# Patient Record
Sex: Female | Born: 1990 | Race: Black or African American | Hispanic: No | Marital: Single | Smoking: Never smoker
Health system: Southern US, Community
[De-identification: ages and names within clinical notes are randomized; demographics above are authoritative.]

## PROBLEM LIST (undated history)

## (undated) HISTORY — PX: KNEE ARTHROSCOPY: SUR90

---
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
## 2010-01-19 ENCOUNTER — Ambulatory Visit: Payer: Self-pay | Admitting: Physician Assistant

## 2010-01-20 ENCOUNTER — Encounter: Admission: RE | Admit: 2010-01-20 | Discharge: 2010-01-20 | Payer: Self-pay | Admitting: Family Medicine

## 2010-02-09 ENCOUNTER — Ambulatory Visit: Payer: Self-pay | Admitting: Family Medicine

## 2010-04-01 ENCOUNTER — Encounter (INDEPENDENT_AMBULATORY_CARE_PROVIDER_SITE_OTHER): Payer: Self-pay | Admitting: *Deleted

## 2010-04-09 ENCOUNTER — Emergency Department (HOSPITAL_COMMUNITY)
Admission: EM | Admit: 2010-04-09 | Discharge: 2010-04-09 | Payer: Self-pay | Source: Home / Self Care | Admitting: Emergency Medicine

## 2010-04-23 ENCOUNTER — Encounter: Admission: RE | Admit: 2010-04-23 | Discharge: 2010-04-23 | Payer: Self-pay | Admitting: Gastroenterology

## 2010-04-29 ENCOUNTER — Emergency Department (HOSPITAL_COMMUNITY): Admission: EM | Admit: 2010-04-29 | Discharge: 2010-03-18 | Payer: Self-pay | Admitting: Emergency Medicine

## 2010-05-03 ENCOUNTER — Encounter (INDEPENDENT_AMBULATORY_CARE_PROVIDER_SITE_OTHER): Payer: Self-pay | Admitting: *Deleted

## 2010-05-07 ENCOUNTER — Ambulatory Visit: Payer: Self-pay | Admitting: Gastroenterology

## 2010-06-22 NOTE — Letter (Signed)
Summary: New Patient letter  [facility]  [address] [place], [place] [address]   Phone: [phone]  Fax: [phone]       04/01/2010 MRN: [id number]    Gloria Edwards [address] [place], [place]  [address]  Dear Ms. Kellie Shropshire,  Welcome to the Gastroenterology Division at [facility].    You are scheduled to see DR. PATTERSON on May 07, 2010 at 9:00 A.M. on the 3rd floor at [facility], [address]  We ask that you try to arrive at our office 15 minutes prior to your appointment time to allow for check-in.  We would like you to complete the enclosed self-administered evaluation form prior to your visit and bring it with you on the day of your appointment.  We will review it with you.  Also, please bring a complete list of all your medications or, if you prefer, bring the medication bottles and we will list them.  Please bring your insurance card so that we may make a copy of it.  If your insurance requires a referral to see a specialist, please bring your referral form from your primary care physician.  Co-payments are due at the time of your visit and may be paid by cash, check or credit card.     Your office visit will consist of a consult with your physician (includes a physical exam), any laboratory testing he/she may order, scheduling of any necessary diagnostic testing (e.g. x-ray, ultrasound, CT-scan), and scheduling of a procedure (e.g. Endoscopy, Colonoscopy) if required.  Please allow enough time on your schedule to allow for any/all of these possibilities.    If you cannot keep your appointment, please call [phone] to cancel or reschedule prior to your appointment date.  This allows [place] the opportunity to schedule an appointment for another patient in need of care.  If you do not cancel or reschedule by 5 p.m. the business day prior to your appointment date, you will be charged a $50.00 late cancellation/no-show fee.    Thank you for  choosing [facility] for your medical needs.  We appreciate the opportunity to care for you.  Please visit [place] at our website  to learn more about our practice.                     Sincerely,                                                             The Gastroenterology Division

## 2010-06-24 NOTE — Miscellaneous (Signed)
Summary: Dr. Laural Benes pt  Gloria Edwards is currently seeing Dr. Laural Benes, she is aware her appt has been cxed.

## 2010-08-03 LAB — URINALYSIS, ROUTINE W REFLEX MICROSCOPIC
Glucose, UA: NEGATIVE mg/dL
Ketones, ur: NEGATIVE mg/dL
Nitrite: NEGATIVE
Protein, ur: NEGATIVE mg/dL
Urobilinogen, UA: 0.2 mg/dL (ref 0.0–1.0)

## 2010-08-03 LAB — POCT PREGNANCY, URINE: Preg Test, Ur: NEGATIVE

## 2010-08-04 LAB — DIFFERENTIAL
Basophils Absolute: 0 10*3/uL (ref 0.0–0.1)
Basophils Relative: 0 % (ref 0–1)
Eosinophils Relative: 1 % (ref 0–5)
Lymphocytes Relative: 28 % (ref 12–46)
Monocytes Absolute: 0.7 10*3/uL (ref 0.1–1.0)
Neutro Abs: 4.9 10*3/uL (ref 1.7–7.7)

## 2010-08-04 LAB — CBC
MCHC: 33.1 g/dL (ref 30.0–36.0)
Platelets: 313 10*3/uL (ref 150–400)
RDW: 13.5 % (ref 11.5–15.5)
WBC: 8 10*3/uL (ref 4.0–10.5)

## 2010-08-04 LAB — HEPATIC FUNCTION PANEL
ALT: 13 U/L (ref 0–35)
AST: 22 U/L (ref 0–37)
Bilirubin, Direct: 0.1 mg/dL (ref 0.0–0.3)
Indirect Bilirubin: 0.2 mg/dL — ABNORMAL LOW (ref 0.3–0.9)
Total Protein: 7.2 g/dL (ref 6.0–8.3)

## 2010-08-04 LAB — BASIC METABOLIC PANEL
BUN: 5 mg/dL — ABNORMAL LOW (ref 6–23)
Calcium: 9 mg/dL (ref 8.4–10.5)
Creatinine, Ser: 0.61 mg/dL (ref 0.4–1.2)
GFR calc non Af Amer: >60 mL/min (ref >60)
Glucose, Bld: 105 mg/dL — ABNORMAL HIGH (ref 70–99)

## 2010-08-09 ENCOUNTER — Emergency Department (HOSPITAL_COMMUNITY): Payer: No Typology Code available for payment source

## 2010-08-09 ENCOUNTER — Emergency Department (HOSPITAL_COMMUNITY)
Admission: EM | Admit: 2010-08-09 | Discharge: 2010-08-09 | Disposition: A | Discharge status: Home / Self Care | Payer: No Typology Code available for payment source | Attending: Emergency Medicine | Admitting: Emergency Medicine

## 2010-08-09 DIAGNOSIS — M25539 Pain in unspecified wrist: Secondary | ICD-10-CM | POA: Insufficient documentation

## 2010-08-09 DIAGNOSIS — M25569 Pain in unspecified knee: Secondary | ICD-10-CM | POA: Insufficient documentation

## 2010-08-09 DIAGNOSIS — Y929 Unspecified place or not applicable: Secondary | ICD-10-CM | POA: Insufficient documentation

## 2010-08-09 DIAGNOSIS — M25469 Effusion, unspecified knee: Secondary | ICD-10-CM | POA: Insufficient documentation

## 2010-08-09 DIAGNOSIS — Z9889 Other specified postprocedural states: Secondary | ICD-10-CM | POA: Insufficient documentation

## 2010-08-09 DIAGNOSIS — M25519 Pain in unspecified shoulder: Secondary | ICD-10-CM | POA: Insufficient documentation

## 2010-08-09 DIAGNOSIS — M79609 Pain in unspecified limb: Secondary | ICD-10-CM | POA: Insufficient documentation

## 2010-08-09 LAB — POCT URINALYSIS DIP (DEVICE)
Bilirubin Urine: NEGATIVE
Ketones, ur: NEGATIVE mg/dL
Protein, ur: 30 mg/dL — AB
Specific Gravity, Urine: >=1.03 (ref 1.005–1.030)

## 2010-08-09 LAB — POCT PREGNANCY, URINE: Preg Test, Ur: NEGATIVE

## 2010-08-11 ENCOUNTER — Inpatient Hospital Stay (INDEPENDENT_AMBULATORY_CARE_PROVIDER_SITE_OTHER)
Admission: RE | Admit: 2010-08-11 | Discharge: 2010-08-11 | Disposition: A | Discharge status: Home / Self Care | Payer: No Typology Code available for payment source | Source: Ambulatory Visit | Attending: Family Medicine | Admitting: Family Medicine

## 2010-08-11 DIAGNOSIS — M79609 Pain in unspecified limb: Secondary | ICD-10-CM

## 2010-11-09 ENCOUNTER — Inpatient Hospital Stay (INDEPENDENT_AMBULATORY_CARE_PROVIDER_SITE_OTHER)
Admission: RE | Admit: 2010-11-09 | Discharge: 2010-11-09 | Disposition: A | Discharge status: Home / Self Care | Source: Ambulatory Visit | Attending: Emergency Medicine | Admitting: Emergency Medicine

## 2010-11-09 DIAGNOSIS — R51 Headache: Secondary | ICD-10-CM

## 2010-11-09 DIAGNOSIS — N739 Female pelvic inflammatory disease, unspecified: Secondary | ICD-10-CM

## 2010-11-09 DIAGNOSIS — N760 Acute vaginitis: Secondary | ICD-10-CM

## 2010-11-09 LAB — WET PREP, GENITAL
Trich, Wet Prep: NONE SEEN
Yeast Wet Prep HPF POC: NONE SEEN

## 2010-11-09 LAB — POCT URINALYSIS DIP (DEVICE)
Bilirubin Urine: NEGATIVE
Ketones, ur: NEGATIVE mg/dL
Leukocytes, UA: NEGATIVE
Specific Gravity, Urine: 1.02 (ref 1.005–1.030)

## 2010-11-09 LAB — POCT PREGNANCY, URINE: Preg Test, Ur: NEGATIVE

## 2010-12-13 ENCOUNTER — Inpatient Hospital Stay (INDEPENDENT_AMBULATORY_CARE_PROVIDER_SITE_OTHER)
Admission: RE | Admit: 2010-12-13 | Discharge: 2010-12-13 | Disposition: A | Discharge status: Home / Self Care | Source: Ambulatory Visit | Attending: Family Medicine | Admitting: Family Medicine

## 2010-12-13 DIAGNOSIS — L259 Unspecified contact dermatitis, unspecified cause: Secondary | ICD-10-CM

## 2011-04-18 ENCOUNTER — Encounter: Payer: Self-pay | Admitting: Family Medicine

## 2011-08-13 IMAGING — CR DG UGI W/ SMALL BOWEL HIGH DENSITY
18 of 24 series · 18 of 24 positions shown · IV contrast (agent unspecified)
Comparison: None.

CLINICAL DATA: 19-year-3-month-old female with chronic pelvic pain,
possible chronic ileitis.

UPPER GI W/ SMALL BOWEL HIGH DENSITY
TECHNIQUE: Upper GI series performed with high density barium and
effervescent agent. Thin barium also used. Subsequently, serial
images of the small bowel were obtained including spot views of the
terminal ileum.
Fluoroscopy Time: 3.7 minutes.
Contrast: High density barium, thin barium, and gas.

[run (1 of 18)]
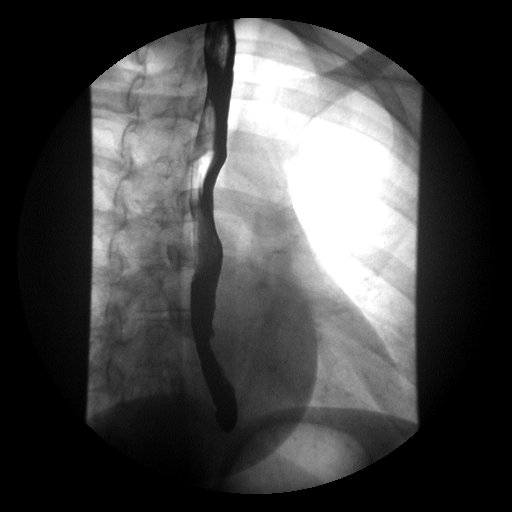

[run (2 of 18)]
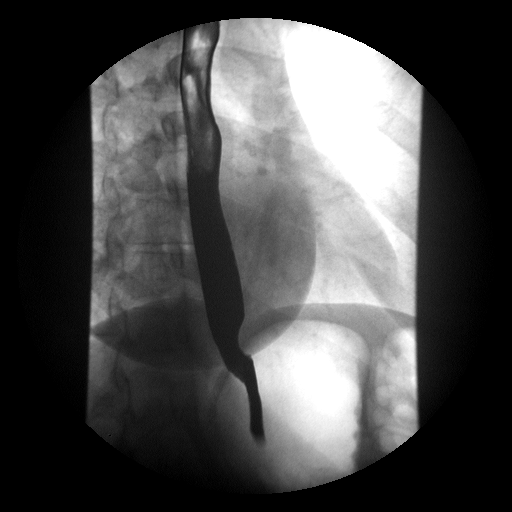

[run (3 of 18)]
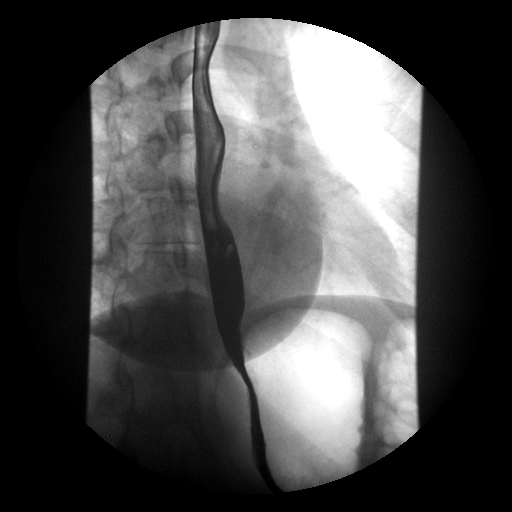

[run (4 of 18)]
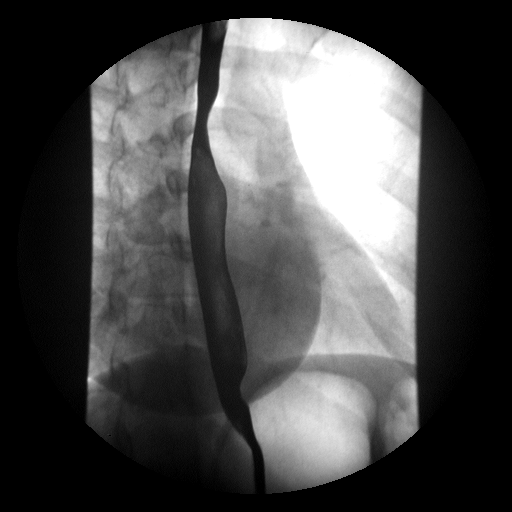

[run (5 of 18)]
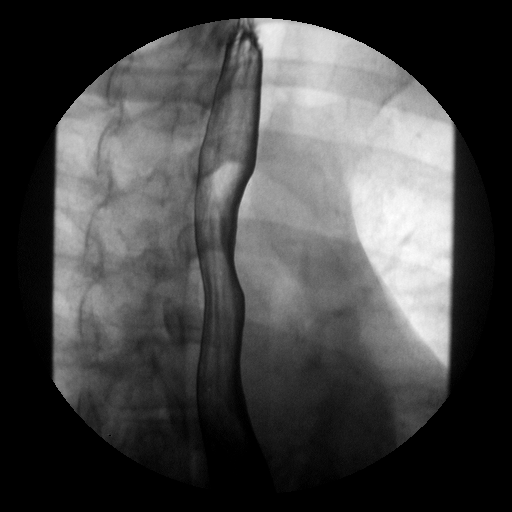

[run (6 of 18)]
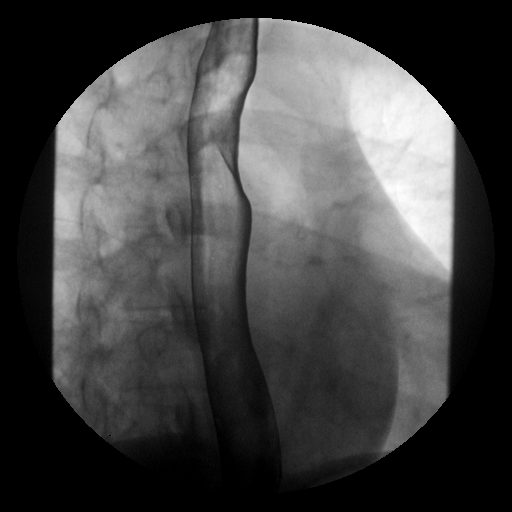

[run (7 of 18)]
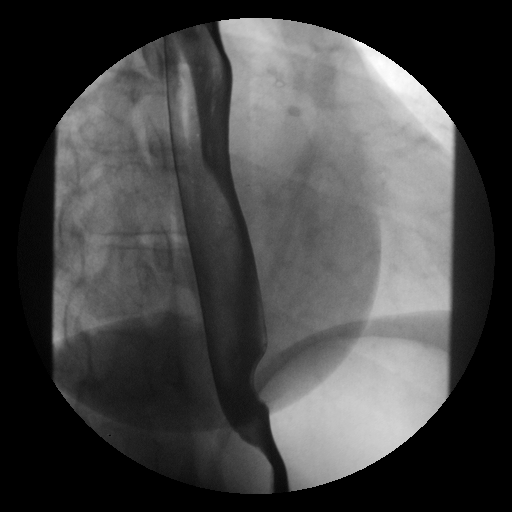

[run (8 of 18)]
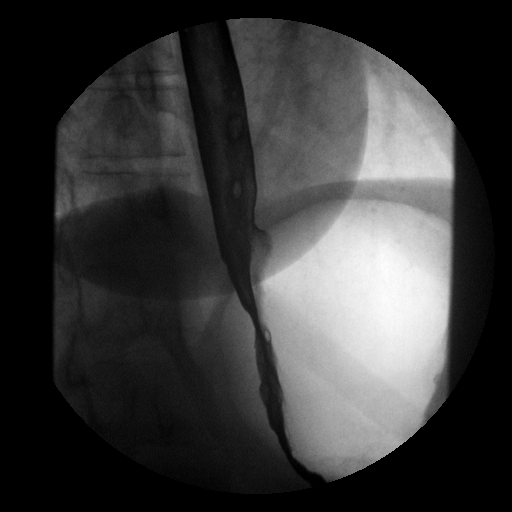

[run (9 of 18)]
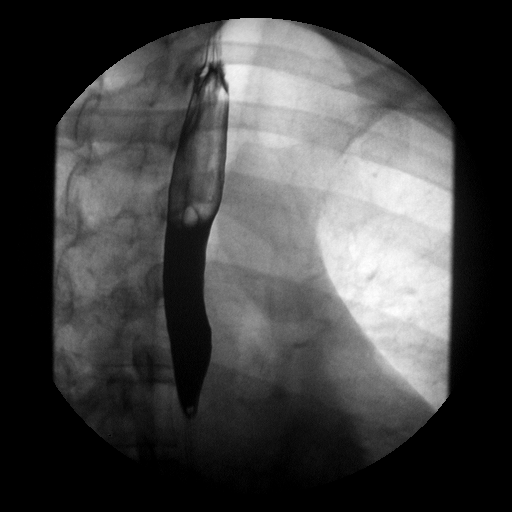

[run (10 of 18)]
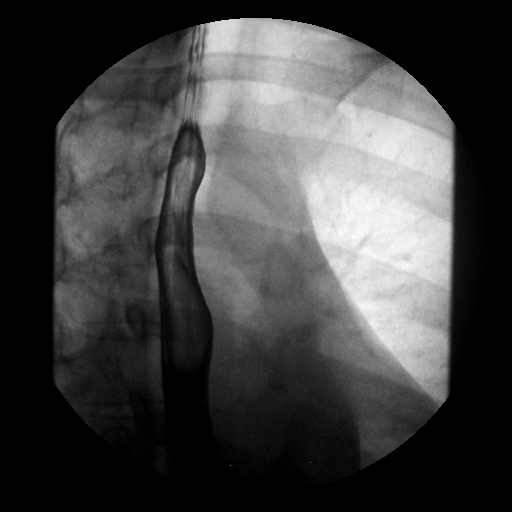

[run (11 of 18)]
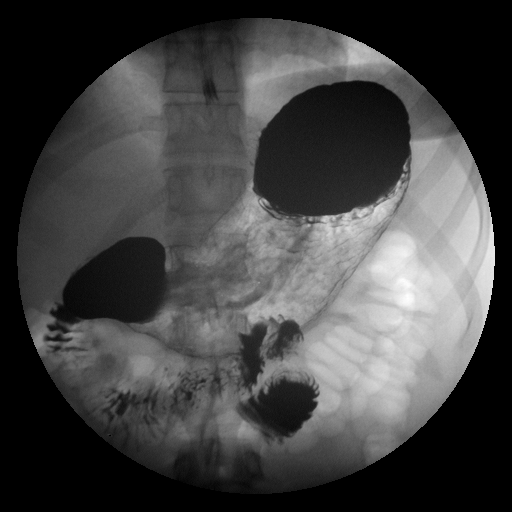

[run (12 of 18)]
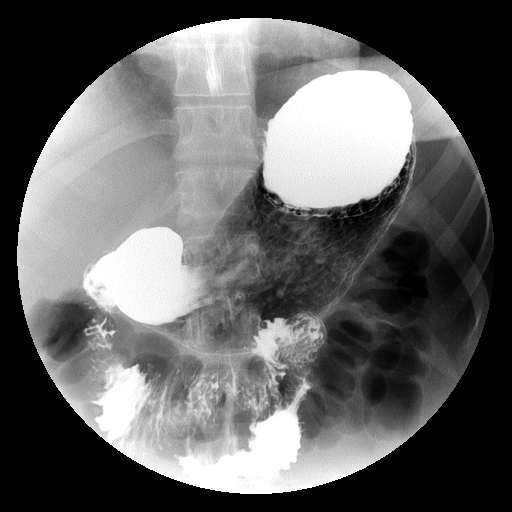

[run (13 of 18)]
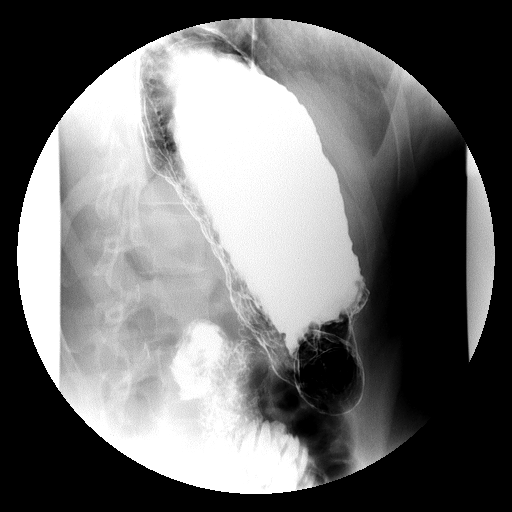

[run (14 of 18)]
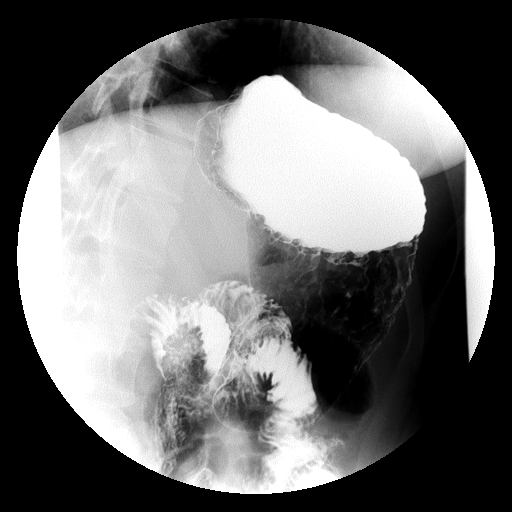

[run (15 of 18)]
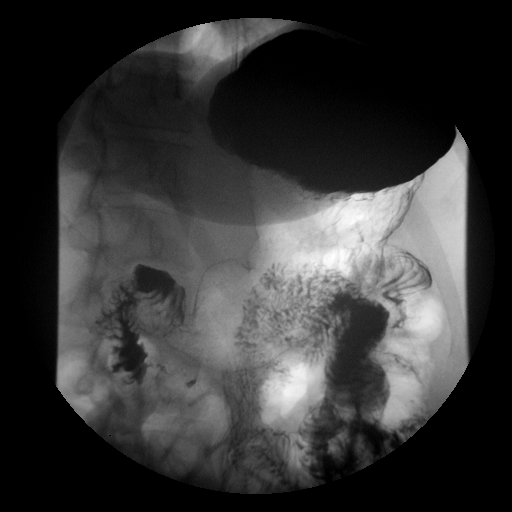

[run (16 of 18)]
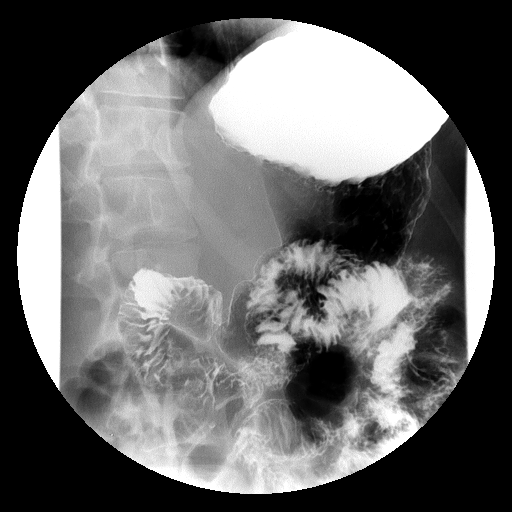

[run (17 of 18)]
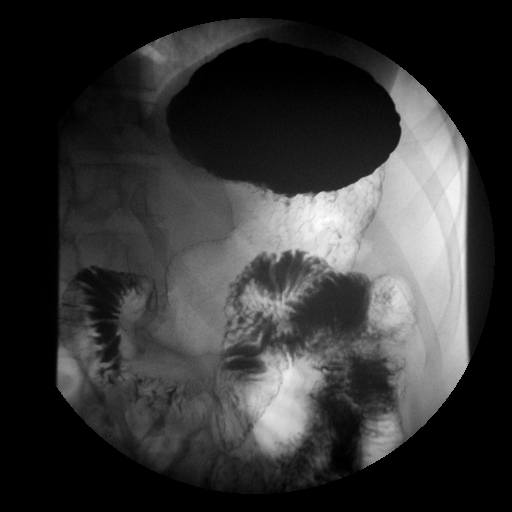

[run (18 of 18)]
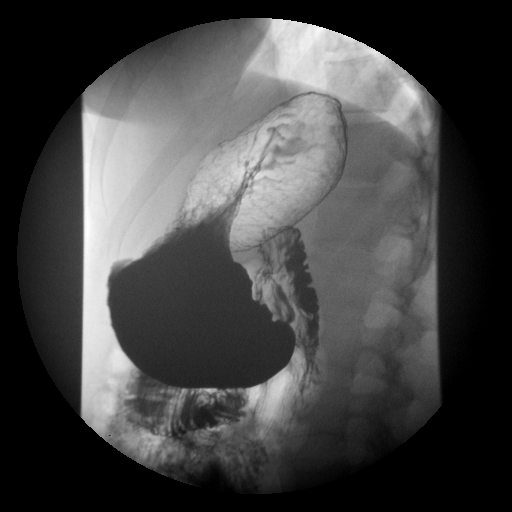

[18 of 24 positions shown; findings below may reference images not displayed]

FINDINGS: A double contrast study was undertaken and the patient
tolerated this well and without difficulty.

Preprocedural scout view demonstrates a nonobstructed bowel gas
pattern. No osseous abnormality identified.  Abdominal and pelvic
visceral contours are within normal limits.

No obstruction to the forward flow contrast throughout the
esophagus and into the stomach.  Normal esophageal course and
contour.

Good gastric coating with contrast.  Gastric mucosal pattern is
within normal limits.  Prompt gastric emptying.  On one occasion,
a small volume of spontaneous gastroesophageal reflux was observed
(series 27).

Duodenal bulb and duodenal mucosal pattern are within normal
limits.  The small bowel is normally rotated.  A small bowel follow
through was then undertaken.

At 50-minutes contrast has reached the mid to distal ileum.
Jejunal and ileal mucosal pattern and caliber are normal. Transit
time to the colon is less than 1 hour and 20 minutes.

Fluoroscopic and spot film evaluation of small bowel was then
undertaken.  The terminal ileum is within normal  limits (series
2).  There is some barium filling of the appendix.  Ileum loops
within the pelvis are within normal limits.  No small bowel
abnormality is identified.
IMPRESSION: 1.  Normal upper GI aside from a single episode of small volume
spontaneous gastroesophageal reflux - clinical significance
uncertain.
2.  Normal small bowel follow-through.  Transit time to the colon
is approximately 1 hour.

## 2018-08-05 ENCOUNTER — Encounter: Payer: Self-pay | Admitting: Emergency Medicine

## 2018-08-05 ENCOUNTER — Emergency Department (HOSPITAL_COMMUNITY)
Admission: EM | Admit: 2018-08-05 | Discharge: 2018-08-05 | Disposition: A | Discharge status: Home / Self Care | Attending: Emergency Medicine | Admitting: Emergency Medicine

## 2018-08-05 DIAGNOSIS — J101 Influenza due to other identified influenza virus with other respiratory manifestations: Secondary | ICD-10-CM | POA: Insufficient documentation

## 2018-08-05 DIAGNOSIS — J111 Influenza due to unidentified influenza virus with other respiratory manifestations: Secondary | ICD-10-CM

## 2018-08-05 LAB — INFLUENZA PANEL BY PCR (TYPE A & B)
INFLBPCR: NEGATIVE
Influenza A By PCR: POSITIVE — AB

## 2018-08-05 MED ORDER — ACETAMINOPHEN 325 MG PO TABS
650.0000 mg | ORAL_TABLET | Freq: Once | ORAL | Status: AC
  Administered 2018-08-05: 650 mg via ORAL
  Filled 2018-08-05: qty 2

## 2018-08-05 NOTE — ED Provider Notes (Signed)
[facility] EMERGENCY DEPARTMENT Provider Note   CSN: [id number] Arrival date & time: 08/05/18  1857    History   Chief Complaint Chief Complaint  Patient presents with  . Cough    HPI Gloria Edwards is a 28 y.o. female.     The history is provided by the patient. No language interpreter was used.  Cough  Cough characteristics:  Productive Sputum characteristics:  Nondescript Severity:  Moderate Onset quality:  Gradual Duration:  2 days Timing:  Constant Progression:  Worsening Chronicity:  New Smoker: no   Relieved by:  Nothing Worsened by:  Nothing Ineffective treatments:  None tried Associated symptoms: fever and shortness of breath   Pt complains of a cough and congestion.  Pt reports she traveled to [place] 3 days ago.   History reviewed. No pertinent past medical history.  There are no active problems to display for this patient.   Past Surgical History:  Procedure Laterality Date  . CESAREAN SECTION    . KNEE ARTHROSCOPY       OB History   No obstetric history on file.      Home Medications    Prior to Admission medications   Not on File    Family History Family History  Problem Relation Age of Onset  . Hypertension Father   . Stroke Paternal Grandfather     Social History Social History   Tobacco Use  . Smoking status: Never Smoker  . Smokeless tobacco: Never Used  Substance Use Topics  . Alcohol use: Not on file  . Drug use: Not on file     Allergies   Amoxicillin   Review of Systems Review of Systems  Constitutional: Positive for fever.  Respiratory: Positive for cough and shortness of breath.   All other systems reviewed and are negative.    Physical Exam Updated Vital Signs BP 120/85   Pulse (!) 108   Temp (!) 102.8 F (39.3 C) (Oral)   Resp 18   Ht 5' 5.5" (1.664 m)   Wt 74.8 kg   LMP 07/20/2018 (Exact Date)   SpO2 98%   BMI 27.04 kg/m   Physical Exam Vitals signs and nursing note  reviewed.  Constitutional:      Appearance: She is well-developed.  HENT:     Head: Normocephalic.     Right Ear: Tympanic membrane normal.     Left Ear: Tympanic membrane normal.     Nose: Nose normal.     Mouth/Throat:     Mouth: Mucous membranes are moist.  Eyes:     Pupils: Pupils are equal, round, and reactive to light.  Neck:     Musculoskeletal: Normal range of motion.  Cardiovascular:     Rate and Rhythm: Normal rate.  Pulmonary:     Effort: Pulmonary effort is normal.  Abdominal:     General: Abdomen is flat. There is no distension.  Musculoskeletal: Normal range of motion.  Skin:    General: Skin is warm.  Neurological:     Mental Status: She is alert and oriented to person, place, and time.  Psychiatric:        Mood and Affect: Mood normal.      ED Treatments / Results  Labs (all labs ordered are listed, but only abnormal results are displayed) Labs Reviewed  INFLUENZA PANEL BY PCR (TYPE A & B) - Abnormal; Notable for the following components:      Result Value   Influenza A By PCR  POSITIVE (*)    All other components within normal limits    EKG None  Radiology No results found.  Procedures Procedures (including critical care time)  Medications Ordered in ED Medications  acetaminophen (TYLENOL) tablet 650 mg (650 mg Oral Given 08/05/18 1913)     Initial Impression / Assessment and Plan / ED Course  I have reviewed the triage vital signs and the nursing notes.  Pertinent labs & imaging results that were available during my care of the patient were reviewed by me and considered in my medical decision making (see chart for details).        MDM   Pt is positive for influenza.  Pt advised to follow up with her Physicain for recheck. Out of work x 3 days   Final Clinical Impressions(s) / ED Diagnoses   Final diagnoses:  Influenza    ED Discharge Orders    None    An After Visit Summary was printed and given to the patient.   Osie Cheeks 08/05/18 2132    Charlynne Pander, MD 08/05/18 [phone]

## 2018-08-05 NOTE — ED Triage Notes (Signed)
Pt presents with 3 day h/o productive cough with white phlegm, fever and 1 episode of vomiting today.  Pt travelled from [place] x 3 days via car.  Pt is 2 months post partum, not breastfeeding.  No none CoVid-19 contact.

## 2018-08-05 NOTE — Discharge Instructions (Addendum)
Return if any problems.  Tylenol every 4 hours for fever °

## 2021-03-18 ENCOUNTER — Other Ambulatory Visit: Payer: Self-pay

## 2021-03-18 ENCOUNTER — Encounter: Payer: Self-pay | Admitting: Intensive Care

## 2021-03-18 ENCOUNTER — Emergency Department
Admission: EM | Admit: 2021-03-18 | Discharge: 2021-03-18 | Disposition: A | Discharge status: Home / Self Care | Payer: BC Managed Care – PPO | Attending: Emergency Medicine | Admitting: Emergency Medicine

## 2021-03-18 DIAGNOSIS — M542 Cervicalgia: Secondary | ICD-10-CM | POA: Diagnosis not present

## 2021-03-18 DIAGNOSIS — S29011A Strain of muscle and tendon of front wall of thorax, initial encounter: Secondary | ICD-10-CM | POA: Insufficient documentation

## 2021-03-18 DIAGNOSIS — M25511 Pain in right shoulder: Secondary | ICD-10-CM | POA: Insufficient documentation

## 2021-03-18 DIAGNOSIS — X58XXXA Exposure to other specified factors, initial encounter: Secondary | ICD-10-CM | POA: Insufficient documentation

## 2021-03-18 DIAGNOSIS — T148XXA Other injury of unspecified body region, initial encounter: Secondary | ICD-10-CM

## 2021-03-18 DIAGNOSIS — N644 Mastodynia: Secondary | ICD-10-CM

## 2021-03-18 DIAGNOSIS — S299XXA Unspecified injury of thorax, initial encounter: Secondary | ICD-10-CM | POA: Diagnosis present

## 2021-03-18 MED ORDER — CYCLOBENZAPRINE HCL 5 MG PO TABS: 5.0000 mg | ORAL_TABLET | Freq: Three times a day (TID) | ORAL | 0 refills | Status: AC | PRN

## 2021-03-18 NOTE — ED Provider Notes (Signed)
[facility] Emergency Department Provider Note  Time seen: 12:43 PM  I have reviewed the triage vital signs and the nursing notes.   HISTORY  Chief Complaint Breast Pain   HPI Gloria Edwards is a 30 y.o. female with no past medical history who presents to the emergency department for right neck/shoulder discomfort as well as left breast discomfort.  According to the patient she awoke this morning with pain to her right neck into the right shoulder.  States it is worse with movement of the neck and somewhat with movement of the shoulder.  No fever.  No headache.  As a secondary complaint patient states she has been experiencing intermittent left breast discomfort as well as some nodules palpated to the left breast since August.  States she saw her provider in August who states they would keep an eye on it but she states that has not been improving and her provider is not located in the state.  Patient denies any nipple discharge.  Denies any redness skin or color changes to the breast.  No known family breast cancer history.  History reviewed. No pertinent past medical history.  There are no problems to display for this patient.   Past Surgical History:  Procedure Laterality Date   CESAREAN SECTION     KNEE ARTHROSCOPY      Prior to Admission medications   Not on File    Allergies  Allergen Reactions   Amoxicillin Anaphylaxis    Family History  Problem Relation Age of Onset   Hypertension Father    Stroke Paternal Grandfather     Social History Social History   Tobacco Use   Smoking status: Never   Smokeless tobacco: Never  Substance Use Topics   Alcohol use: Never   Drug use: Never    Review of Systems Constitutional: Negative for fever. Cardiovascular: Negative for chest pain. Respiratory: Negative for shortness of breath. Gastrointestinal: Negative for abdominal pain Musculoskeletal: Right-sided neck/shoulder discomfort worse with  movement.  Left breast discomfort. Skin: Negative for skin complaints  Neurological: Negative for headache All other ROS negative  ____________________________________________   PHYSICAL EXAM:  VITAL SIGNS: ED Triage Vitals  Enc Vitals Group     BP 03/18/21 1145 (!) 148/101     Pulse Rate 03/18/21 1145 87     Resp 03/18/21 1145 16     Temp 03/18/21 1145 98 F (36.7 C)     Temp Source 03/18/21 1145 Oral     SpO2 03/18/21 1145 100 %     Weight 03/18/21 1146 180 lb (81.6 kg)     Height 03/18/21 1146 5' 5.5" (1.664 m)     Head Circumference --      Peak Flow --      Pain Score 03/18/21 1146 9     Pain Loc --      Pain Edu? --      Excl. in GC? --    Constitutional: Alert and oriented. Well appearing and in no distress. Eyes: Normal exam ENT      Head: Normocephalic and atraumatic.      Mouth/Throat: Mucous membranes are moist. Cardiovascular: Normal rate, regular rhythm.  Respiratory: Normal respiratory effort without tachypnea nor retractions. Breath sounds are clear  Gastrointestinal: Soft and nontender. No distention.   Musculoskeletal: Nontender with normal range of motion in all extremities.  On left breast examination patient does have some nodules/bumps present to the left lower quadrant of the breast, no definitive large mass  or nodule noted.  Could very likely be consistent with fibrocystic changes.  No nipple or areola discomfort no discharge, no skin color changes.  Patient does have moderate right trapezius tenderness to palpation, worse with movement of the right shoulder or neck.  No midline tenderness. Neurologic:  Normal speech and language. No gross focal neurologic deficits Skin:  Skin is warm, dry and intact.  Psychiatric: Mood and affect are normal.   ____________________________________________   INITIAL IMPRESSION / ASSESSMENT AND PLAN / ED COURSE  Pertinent labs & imaging results that were available during my care of the patient were reviewed by me  and considered in my medical decision making (see chart for details).   Patient presents to the emergency department symptoms of trapezius strain with tenderness to palpation of the right trapezius worse with movement.  We will place the patient on Flexeril to be used as needed in addition discussed Tylenol or ibuprofen as written on the box.  Patient agreeable to plan of care.  As far as the left breast discomfort patient does have small amount of bumps noted to the left lower quadrant of the breast, could very likely be consistent with fibrocystic changes.  States she mentioned it to her doctor in August but states it is not improved over the past 2 months.  I do believe patient would benefit from further work-up at the breast clinic and possible mammogram versus ultrasound.  Patient agreeable to plan of care.  [name] was evaluated in Emergency Department on 03/18/2021 for the symptoms described in the history of present illness. She was evaluated in the context of the global COVID-19 pandemic, which necessitated consideration that the patient might be at risk for infection with the SARS-CoV-2 virus that causes COVID-19. Institutional protocols and algorithms that pertain to the evaluation of patients at risk for COVID-19 are in a state of rapid change based on information released by regulatory bodies including the CDC and federal and state organizations. These policies and algorithms were followed during the patient's care in the ED.  ____________________________________________   FINAL CLINICAL IMPRESSION(S) / ED DIAGNOSES  Breast pain Muscular strain   Minna Antis, MD 03/18/21 1247

## 2021-03-18 NOTE — ED Triage Notes (Signed)
Patient c/o middle neck pain that radiates down right arm and headache that just started this AM. Reports left breast pain that has worsened in the last week. Reports pain has been off and on for awhile. Denies discharge or hardness/redness to breast area

## 2021-03-18 NOTE — ED Provider Notes (Signed)
Emergency Medicine Provider Triage Evaluation Note  Gloria Edwards , a 30 y.o. female  was evaluated in triage.  Pt complains of neck pain that radiates into the right shoulder and arm. Symptoms present upon awakening this am. Pain increases with looking down.  Also complains of left breast pain She has had some left breast lumps since August, but has had throbbing for the past week. LMP was Mar 03, 2021.  Review of Systems  Positive: Neck and breast pain Negative: Fever, injury  Physical Exam  There were no vitals taken for this visit. Gen:   Awake, no distress   Resp:  Normal effort  MSK:   Moves extremities without difficulty  Other:    Medical Decision Making  Medically screening exam initiated at 11:44 AM.  Appropriate orders placed.  Gloria Edwards was informed that the remainder of the evaluation will be completed by another provider, this initial triage assessment does not replace that evaluation, and the importance of remaining in the ED until their evaluation is complete.    Chinita Pester, FNP 03/18/21 1440    Minna Antis, MD 03/18/21 1733

## 2021-03-18 NOTE — Discharge Instructions (Signed)
Please call the number provided for [facility] to arrange a follow-up appointment for further evaluation of your left breast pain.  A referral has been placed on your behalf.  [facility] (at [facility]) [address] [place], [place] [address] [phone]
